# Patient Record
Sex: Male | Born: 1980 | Race: White | Hispanic: No | Marital: Single | Smoking: Never smoker
Health system: Southern US, Community
[De-identification: ages and names within clinical notes are randomized; demographics above are authoritative.]

---

## 2003-09-19 ENCOUNTER — Emergency Department (HOSPITAL_COMMUNITY): Admission: EM | Admit: 2003-09-19 | Discharge: 2003-09-19 | Payer: Self-pay | Admitting: Emergency Medicine

## 2006-07-08 ENCOUNTER — Ambulatory Visit (HOSPITAL_COMMUNITY): Admission: RE | Admit: 2006-07-08 | Discharge: 2006-07-08 | Payer: Self-pay | Admitting: Specialist

## 2006-07-08 ENCOUNTER — Encounter (INDEPENDENT_AMBULATORY_CARE_PROVIDER_SITE_OTHER): Payer: Self-pay | Admitting: Cardiology

## 2008-02-08 ENCOUNTER — Emergency Department (HOSPITAL_BASED_OUTPATIENT_CLINIC_OR_DEPARTMENT_OTHER): Admission: EM | Admit: 2008-02-08 | Discharge: 2008-02-08 | Payer: Self-pay | Admitting: Emergency Medicine

## 2008-02-15 ENCOUNTER — Emergency Department (HOSPITAL_BASED_OUTPATIENT_CLINIC_OR_DEPARTMENT_OTHER): Admission: EM | Admit: 2008-02-15 | Discharge: 2008-02-15 | Payer: Self-pay | Admitting: Emergency Medicine

## 2008-03-16 ENCOUNTER — Emergency Department (HOSPITAL_BASED_OUTPATIENT_CLINIC_OR_DEPARTMENT_OTHER): Admission: EM | Admit: 2008-03-16 | Discharge: 2008-03-16 | Payer: Self-pay | Admitting: Emergency Medicine

## 2008-05-21 ENCOUNTER — Emergency Department (HOSPITAL_BASED_OUTPATIENT_CLINIC_OR_DEPARTMENT_OTHER): Admission: EM | Admit: 2008-05-21 | Discharge: 2008-05-22 | Payer: Self-pay | Admitting: Emergency Medicine

## 2008-05-22 ENCOUNTER — Ambulatory Visit: Payer: Self-pay | Admitting: Diagnostic Radiology

## 2008-06-12 ENCOUNTER — Emergency Department (HOSPITAL_BASED_OUTPATIENT_CLINIC_OR_DEPARTMENT_OTHER): Admission: EM | Admit: 2008-06-12 | Discharge: 2008-06-12 | Payer: Self-pay | Admitting: Emergency Medicine

## 2008-06-22 ENCOUNTER — Ambulatory Visit: Payer: Self-pay | Admitting: Family Medicine

## 2008-06-22 DIAGNOSIS — S0003XA Contusion of scalp, initial encounter: Secondary | ICD-10-CM | POA: Insufficient documentation

## 2008-06-22 DIAGNOSIS — S1093XA Contusion of unspecified part of neck, initial encounter: Secondary | ICD-10-CM

## 2008-06-22 DIAGNOSIS — S0083XA Contusion of other part of head, initial encounter: Secondary | ICD-10-CM

## 2008-08-09 ENCOUNTER — Encounter: Admission: RE | Admit: 2008-08-09 | Discharge: 2008-08-09 | Payer: Self-pay | Admitting: Specialist

## 2008-10-02 ENCOUNTER — Ambulatory Visit: Payer: Self-pay | Admitting: Diagnostic Radiology

## 2008-10-02 ENCOUNTER — Emergency Department (HOSPITAL_BASED_OUTPATIENT_CLINIC_OR_DEPARTMENT_OTHER): Admission: EM | Admit: 2008-10-02 | Discharge: 2008-10-02 | Payer: Self-pay | Admitting: Emergency Medicine

## 2009-10-31 ENCOUNTER — Ambulatory Visit: Payer: Self-pay | Admitting: Diagnostic Radiology

## 2009-10-31 ENCOUNTER — Emergency Department (HOSPITAL_BASED_OUTPATIENT_CLINIC_OR_DEPARTMENT_OTHER): Admission: EM | Admit: 2009-10-31 | Discharge: 2009-10-31 | Payer: Self-pay | Admitting: Emergency Medicine

## 2010-03-29 ENCOUNTER — Ambulatory Visit: Payer: Self-pay | Admitting: Diagnostic Radiology

## 2010-04-10 ENCOUNTER — Emergency Department (HOSPITAL_BASED_OUTPATIENT_CLINIC_OR_DEPARTMENT_OTHER): Admission: EM | Admit: 2010-04-10 | Discharge: 2010-03-29 | Payer: Self-pay | Admitting: Emergency Medicine

## 2010-06-20 ENCOUNTER — Emergency Department (HOSPITAL_BASED_OUTPATIENT_CLINIC_OR_DEPARTMENT_OTHER)
Admission: EM | Admit: 2010-06-20 | Discharge: 2010-06-20 | Disposition: A | Discharge status: Home / Self Care | Payer: Medicaid Other | Attending: Emergency Medicine | Admitting: Emergency Medicine

## 2010-06-20 DIAGNOSIS — G809 Cerebral palsy, unspecified: Secondary | ICD-10-CM | POA: Insufficient documentation

## 2010-06-20 DIAGNOSIS — L089 Local infection of the skin and subcutaneous tissue, unspecified: Secondary | ICD-10-CM | POA: Insufficient documentation

## 2010-06-20 DIAGNOSIS — M810 Age-related osteoporosis without current pathological fracture: Secondary | ICD-10-CM | POA: Insufficient documentation

## 2010-06-20 DIAGNOSIS — K219 Gastro-esophageal reflux disease without esophagitis: Secondary | ICD-10-CM | POA: Insufficient documentation

## 2010-06-20 DIAGNOSIS — Z79899 Other long term (current) drug therapy: Secondary | ICD-10-CM | POA: Insufficient documentation

## 2010-06-20 DIAGNOSIS — J45909 Unspecified asthma, uncomplicated: Secondary | ICD-10-CM | POA: Insufficient documentation

## 2018-12-25 ENCOUNTER — Other Ambulatory Visit: Payer: Self-pay

## 2018-12-25 ENCOUNTER — Emergency Department (INDEPENDENT_AMBULATORY_CARE_PROVIDER_SITE_OTHER)
Admission: EM | Admit: 2018-12-25 | Discharge: 2018-12-25 | Disposition: A | Discharge status: Home / Self Care | Payer: Medicaid Other | Source: Home / Self Care

## 2018-12-25 ENCOUNTER — Encounter: Payer: Self-pay | Admitting: Emergency Medicine

## 2018-12-25 ENCOUNTER — Emergency Department (INDEPENDENT_AMBULATORY_CARE_PROVIDER_SITE_OTHER): Payer: Medicaid Other

## 2018-12-25 DIAGNOSIS — M79671 Pain in right foot: Secondary | ICD-10-CM

## 2018-12-25 DIAGNOSIS — M7989 Other specified soft tissue disorders: Secondary | ICD-10-CM

## 2018-12-25 NOTE — ED Provider Notes (Signed)
[facility]    CSN: [id number] Arrival date & time: 12/25/18  1551      History   Chief Complaint Chief Complaint  Patient presents with  . Foot Pain    HPI Timothy Preston is a 38 y.o. male.   HPI  Timothy Preston is a 38 y.o. male with hx of cerebral palsy and nonverbal presenting to UC with caregiver with concern for possible Right foot injury after getting his foot caught in a bed rail earlier today where hs is staying.  The doctor at the facility requested he get an x-ray. Caregiver states pt has been able to walk normally and does not appear to be in pain but his foot does appear a little swollen.     History reviewed. No pertinent past medical history.  Patient Active Problem List   Diagnosis Date Noted  . 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 No pertinent surgical history.     Home Medications    Prior to Admission medications   Not on File    Family History No family history on file.  Social History Social History   Tobacco Use  . Smoking status: Never Smoker  . Smokeless tobacco: Never Used  Substance Use Topics  . Alcohol use: Not on file  . Drug use: Not on file     Allergies   Loop diuretics, Sulfonamide derivatives, and Thiazide-type diuretics   Review of Systems Review of Systems  Musculoskeletal: Positive for joint swelling. Negative for arthralgias and myalgias.  Skin: Negative for color change and wound.     Physical Exam Triage Vital Signs ED Triage Vitals  Enc Vitals Group     BP 12/25/18 1618 (!) 133/104     Pulse --      Resp --      Temp --      Temp src --      SpO2 --      Weight 12/25/18 1619 120 lb (54.4 kg)     Height --      Head Circumference --      Peak Flow --      Pain Score 12/25/18 1618 2     Pain Loc --      Pain Edu? --      Excl. in [place]? --    No data found.  Updated Vital Signs BP 113/70 (BP Location: Left Arm)   Pulse 61   Wt 120 lb (54.4 kg)   Visual Acuity  Right Eye Distance:   Left Eye Distance:   Bilateral Distance:    Right Eye Near:   Left Eye Near:    Bilateral Near:     Physical Exam Vitals signs and nursing note reviewed.  Constitutional:      Appearance: Normal appearance. He is well-developed.  HENT:     Head: Normocephalic and atraumatic.  Neck:     Musculoskeletal: Normal range of motion.  Cardiovascular:     Rate and Rhythm: Normal rate.     Pulses:          Dorsalis pedis pulses are 2+ on the right side.       Posterior tibial pulses are 2+ on the right side.  Pulmonary:     Effort: Pulmonary effort is normal.  Musculoskeletal:        General: Swelling and deformity present. No tenderness.     Comments: Right foot: mild edema. Deformity of 2nd toe with apparent surgical scar.  No tenderness  to foot or toes.  Right ankle: full ROM  Skin:    General: Skin is warm and dry.     Capillary Refill: Capillary refill takes less than 2 seconds.     Findings: No bruising or erythema.  Neurological:     Mental Status: He is alert and oriented to person, place, and time.  Psychiatric:        Behavior: Behavior normal.      UC Treatments / Results  Labs (all labs ordered are listed, but only abnormal results are displayed) Labs Reviewed - No data to display  EKG   Radiology Dg Foot Complete Right  Result Date: 12/25/2018 CLINICAL DATA:  Right foot pain EXAM: RIGHT FOOT COMPLETE - 3+ VIEW COMPARISON:  None FINDINGS: Difficult to evaluate the right 2nd toe due to flexed position. No definite fracture. Fusion across the 1st MTP joint. Soft tissues are intact. IMPRESSION: Difficult to evaluate the right 2nd toe due to fixed flexed position. No definite acute bony abnormality. Electronically Signed   By: Charlett NoseKevin  Dover M.D.   On: 12/25/2018 16:49    Procedures Procedures (including critical care time)  Medications Ordered in UC Medications - No data to display  Initial Impression / Assessment and Plan / UC Course  I  have reviewed the triage vital signs and the nursing notes.  Pertinent labs & imaging results that were available during my care of the patient were reviewed by me and considered in my medical decision making (see chart for details).     Reviewed imaging with caregiver Caregiver is unaware of any prior foot surgeries Encouraged f/u with podiatrist for further evaluation of foot   Final Clinical Impressions(s) / UC Diagnoses   Final diagnoses:  Swelling of right foot     Discharge Instructions      He may have Tylenol or Motrin for pain if pain develops.   Due to suspected surgery and chronic deformity of Right foot, it is recommended he be seen by a Podiatrist for at least a consultation and possibly ongoing foot care to help prevent any problems in the future.     ED Prescriptions    None     Controlled Substance Prescriptions [place] Controlled Substance Registry consulted? Not Applicable   Rolla Platehelps, Lacrystal Barbe O, PA-C 12/26/18 1058

## 2018-12-25 NOTE — ED Triage Notes (Signed)
Patient's right foot got caught in a bed rail today.  Some swelling but able to walk on foot w/o compliant.

## 2018-12-25 NOTE — Discharge Instructions (Signed)
°  He may have Tylenol or Motrin for pain if pain develops.   Due to suspected surgery and chronic deformity of Right foot, it is recommended he be seen by a Podiatrist for at least a consultation and possibly ongoing foot care to help prevent any problems in the future.

## 2021-02-04 IMAGING — DX RIGHT FOOT COMPLETE - 3+ VIEW
3 series · 3 of 3 positions shown · non-contrast
Comparison: None

CLINICAL DATA: Right foot pain

EXAM:
RIGHT FOOT COMPLETE - 3+ VIEW

[foot ap]
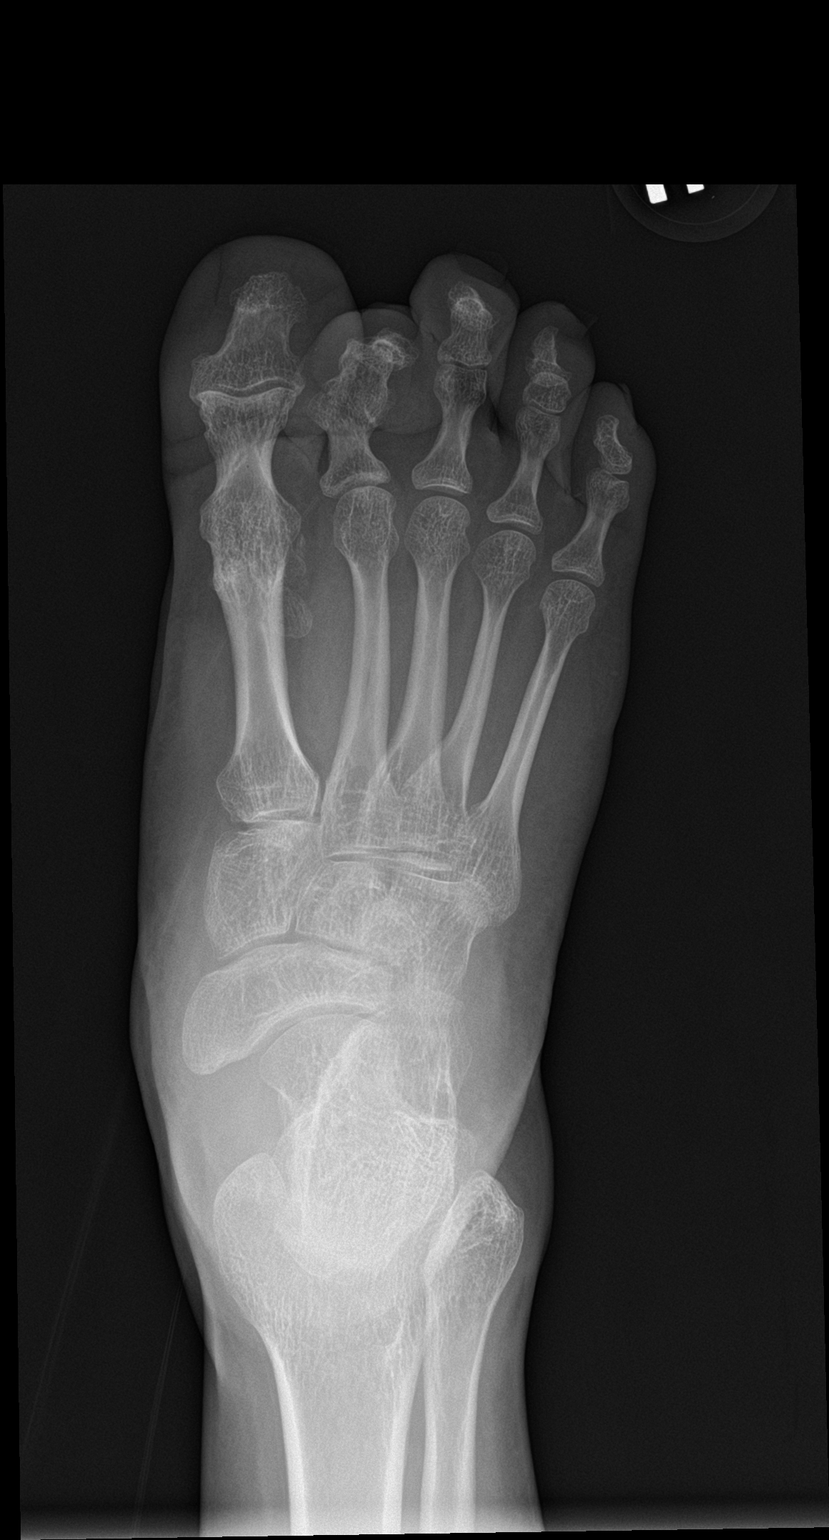

[foot obl]
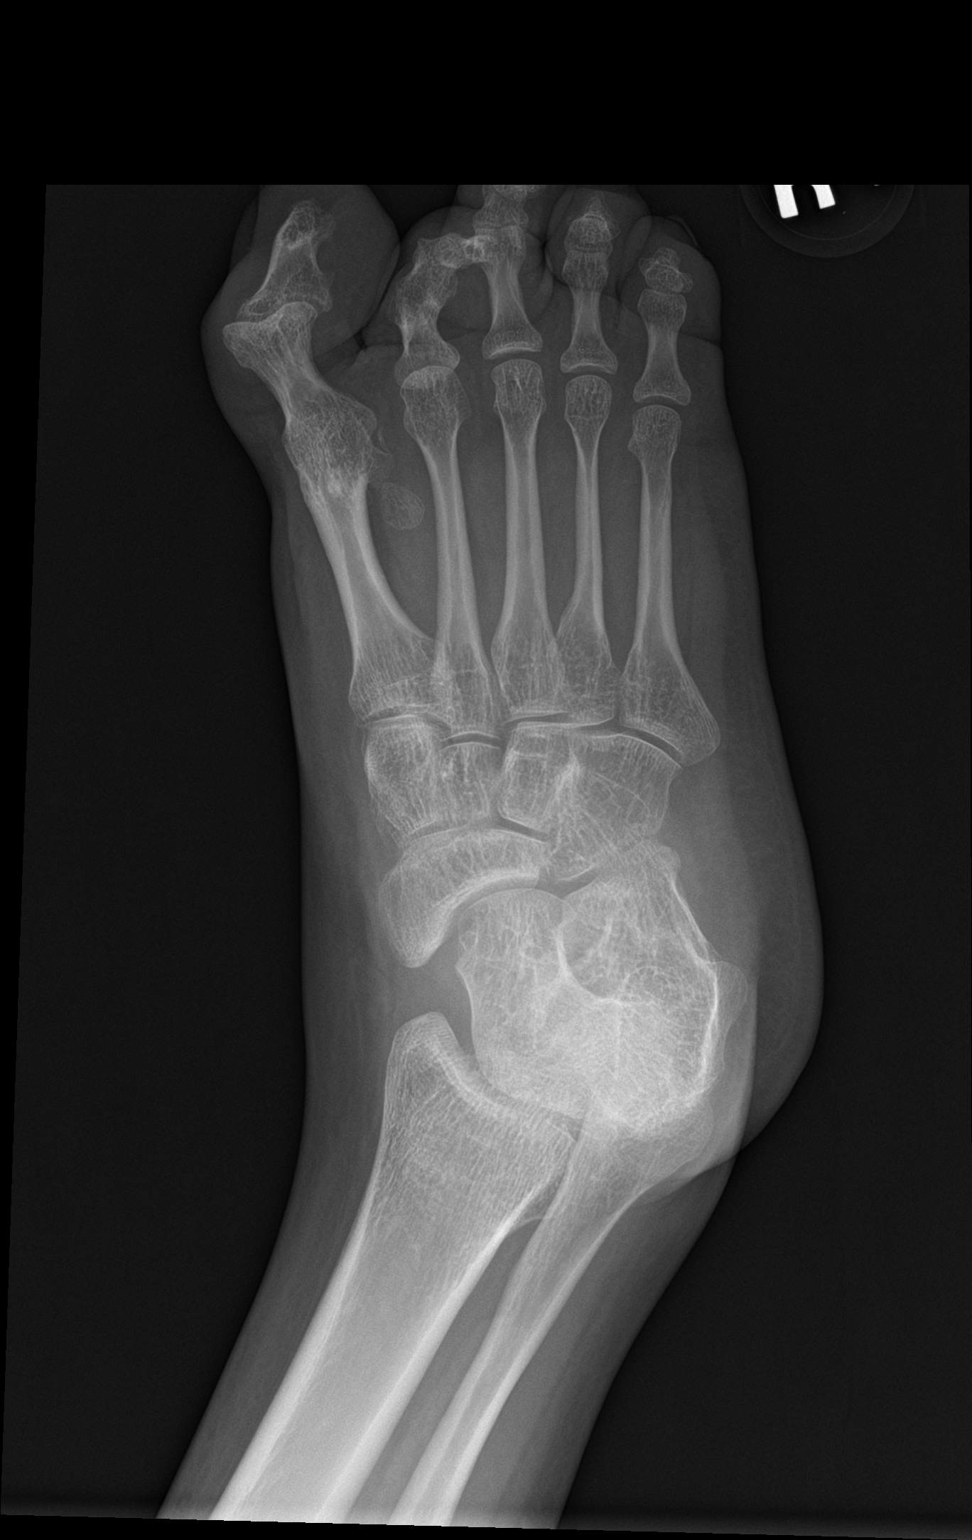

[foot lat]
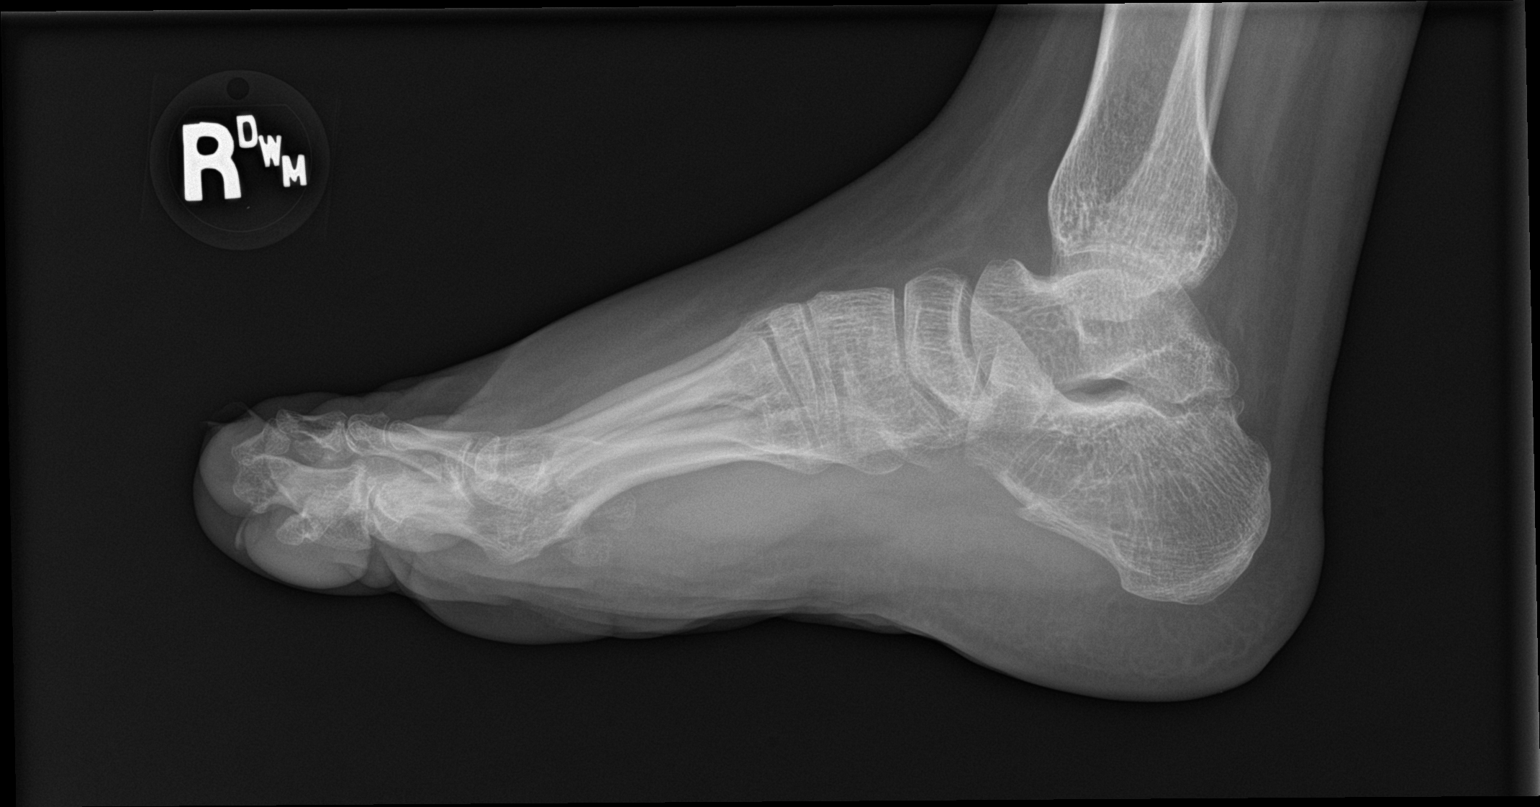

[3 of 3 positions shown; findings below may reference images not displayed]

FINDINGS: Difficult to evaluate the right 2nd toe due to flexed position. No
definite fracture. Fusion across the 1st MTP joint. Soft tissues are
intact.
IMPRESSION: Difficult to evaluate the right 2nd toe due to fixed flexed
position.

No definite acute bony abnormality.

## 2024-02-02 DEATH — deceased
